# Patient Record
Sex: Male | Born: 1970 | Race: White | Hispanic: No | Marital: Single | Smoking: Never smoker
Health system: Southern US, Community
[De-identification: ages and names within clinical notes are randomized; demographics above are authoritative.]

---

## 1997-12-06 ENCOUNTER — Encounter: Payer: Self-pay | Admitting: Internal Medicine

## 1997-12-06 ENCOUNTER — Emergency Department (HOSPITAL_COMMUNITY): Admission: EM | Admit: 1997-12-06 | Discharge: 1997-12-06 | Payer: Self-pay | Admitting: Internal Medicine

## 1999-10-04 ENCOUNTER — Encounter: Payer: Self-pay | Admitting: *Deleted

## 1999-10-04 ENCOUNTER — Encounter: Payer: Self-pay | Admitting: Pulmonary Disease

## 1999-10-04 ENCOUNTER — Emergency Department (HOSPITAL_COMMUNITY): Admission: EM | Admit: 1999-10-04 | Discharge: 1999-10-04 | Payer: Self-pay | Admitting: *Deleted

## 1999-10-19 ENCOUNTER — Encounter: Payer: Self-pay | Admitting: Pulmonary Disease

## 2003-04-19 ENCOUNTER — Other Ambulatory Visit: Payer: Self-pay

## 2004-05-15 ENCOUNTER — Ambulatory Visit: Payer: Self-pay | Admitting: Pulmonary Disease

## 2004-06-15 ENCOUNTER — Ambulatory Visit: Payer: Self-pay | Admitting: Pulmonary Disease

## 2005-10-11 ENCOUNTER — Ambulatory Visit: Payer: Self-pay

## 2006-07-07 ENCOUNTER — Ambulatory Visit: Payer: Self-pay | Admitting: Pulmonary Disease

## 2007-06-11 ENCOUNTER — Telehealth (INDEPENDENT_AMBULATORY_CARE_PROVIDER_SITE_OTHER): Payer: Self-pay | Admitting: *Deleted

## 2007-06-25 DIAGNOSIS — J45909 Unspecified asthma, uncomplicated: Secondary | ICD-10-CM | POA: Insufficient documentation

## 2007-08-03 ENCOUNTER — Telehealth (INDEPENDENT_AMBULATORY_CARE_PROVIDER_SITE_OTHER): Payer: Self-pay | Admitting: *Deleted

## 2007-08-06 ENCOUNTER — Ambulatory Visit: Payer: Self-pay | Admitting: Pulmonary Disease

## 2007-11-17 ENCOUNTER — Telehealth (INDEPENDENT_AMBULATORY_CARE_PROVIDER_SITE_OTHER): Payer: Self-pay | Admitting: *Deleted

## 2007-11-23 ENCOUNTER — Telehealth (INDEPENDENT_AMBULATORY_CARE_PROVIDER_SITE_OTHER): Payer: Self-pay | Admitting: *Deleted

## 2008-07-18 ENCOUNTER — Telehealth (INDEPENDENT_AMBULATORY_CARE_PROVIDER_SITE_OTHER): Payer: Self-pay | Admitting: *Deleted

## 2008-08-26 ENCOUNTER — Ambulatory Visit: Payer: Self-pay | Admitting: Pulmonary Disease

## 2009-08-25 ENCOUNTER — Ambulatory Visit: Payer: Self-pay | Admitting: Pulmonary Disease

## 2009-08-25 DIAGNOSIS — J309 Allergic rhinitis, unspecified: Secondary | ICD-10-CM | POA: Insufficient documentation

## 2010-03-06 NOTE — Assessment & Plan Note (Signed)
Summary: yearly rov for asthma   CC:  Pt is here for a 1 yr f/u appt on his asthma.  Pt states his breathing is "good."  Pt denied a cough.  Pt states he uses his rescue hfa "very little."  .  History of Present Illness: The pt comes in today for f/u of his asthma.  He is doing very well on advair, and hardly ever requires his rescue inhaler.  He denies nocturnal symptoms or cough.  He does have allergy symptoms at times for which he takes claritin.  He doesn't think this helps much.  He also has itchy eyes that he thinks is due to allergies, and keeps him from wearing his contacts.    Current Medications (verified): 1)  Advair Diskus 250-50 Mcg/dose  Misc (Fluticasone-Salmeterol) .... One Puff Twice Daily 2)  Prilosec Otc 20 Mg  Tbec (Omeprazole Magnesium) .... Take 1 Tablet By Mouth Once A Day 3)  Proair Hfa 108 (90 Base) Mcg/act  Aers (Albuterol Sulfate) .... Inhale 2 Puffs Two Times A Day As Needed 4)  Tylenol .... As Needed  Allergies (verified): No Known Drug Allergies  Review of Systems  The patient denies shortness of breath with activity, shortness of breath at rest, productive cough, non-productive cough, coughing up blood, chest pain, irregular heartbeats, acid heartburn, indigestion, loss of appetite, weight change, abdominal pain, difficulty swallowing, sore throat, tooth/dental problems, headaches, nasal congestion/difficulty breathing through nose, sneezing, itching, ear ache, anxiety, depression, hand/feet swelling, joint stiffness or pain, rash, change in color of mucus, and fever.    Vital Signs:  Patient profile:   40 year old male Height:      69.5 inches Weight:      196 pounds BMI:     28.63 O2 Sat:      98 % on Room air Temp:     97.5 degrees F oral Pulse rate:   54 / minute BP sitting:   120 / 70  (left arm) Cuff size:   regular  Vitals Entered By: Arman Filter LPN (August 25, 2009 8:51 AM)  O2 Flow:  Room air CC: Pt is here for a 1 yr f/u appt on his  asthma.  Pt states his breathing is "good."  Pt denied a cough.  Pt states he uses his rescue hfa "very little."   Comments Medications reviewed with patient Arman Filter LPN  August 25, 2009 8:51 AM    Physical Exam  General:  wd male in nad Eyes:  not red or swollen. Nose:  no drainage or purulence noted. Lungs:  clear to auscultation Heart:  rrr, 2/6 sem.   Impression & Recommendations:  Problem # 1:  ASTHMA (ICD-493.90) the pt is doing very well on his current asthma regimen.  He rarely requires his rescue inhaler, and is happy with his exertional tolerance.  HIs only complaint is itchy eyes when his allergies bother him.  Sometimes it is just his eyes and nothing else.  I have asked him to continue on advair for maintenance, and also asked hm to try zyrtec as needed for allergies.  I have also given him samples of pataday for his eye symptoms to see if it will help.  He is to followup with me in one year.  Medications Added to Medication List This Visit: 1)  Tylenol  .... As needed  Other Orders: Est. Patient Level III (24401)  Patient Instructions: 1)  no change in asthma meds 2)  can try zyrtec 10mg   otc at bedtime if having a lot of allergy symptoms. 3)  can try pataday eye drops one drop in affected eye each day  4)  followup with me in one year if doing well.      Immunization History:  Influenza Immunization History:    Influenza:  historical (02/05/2008)

## 2010-06-22 NOTE — Consult Note (Signed)
[place]. [facility] [place]  Patient:    Andre Lamb, Andre Lamb                         MRN: [id number] Proc. Date: 10/04/99 Attending:  Barbaraann Share, M.D. [facility] CC:         Dr. Meliton Rattan, [facility]                          Consultation Report  HISTORY OF PRESENT ILLNESS:  The patient is a very pleasant 40 year old gentleman with a history of bronchial asthma, treated with albuterol p.r.n., who presents with a three to four day history of increasing chest congestion, chest tightness, with increasing shortness of breath as well as cough with purulent mucus.  The patient noticed increasing chest tightness and dry, hacking cough starting approximately three days ago and was seen by Dr. Earlene Plater and placed on Augmentin and asked to continue on his albuterol.  The patient since that time has had worsening congestion with green mucus occurring last night as well as a temperature to 100.1.He also had worsening of his chest congestion to the point that he became dyspneic even at rest.  He was seen by Dr. Earlene Plater this morning, who felt that he had status asthmaticus, and therefore he was transferred to the [facility] Emergency Room for evaluation.  Here he has had significant improvement and currently is in no distress, with no accessory muscle use.  He feels as though he is getting back to his usual baseline.  I have talked with the patient extensively about his asthma history that he has had since age 52.  He has needed very little medication except for p.r.n. albuterol.  However, when I questioned him in detail, he actually uses his albuterol at least every day, and girlfriend with him states sometimes twice a day.  He has not had any recent hospitalizations, nor has he had endotracheal intubation or ventilation because of asthma.  He does have occasional nocturnal awakenings.  PAST MEDICAL HISTORY:  Totally unremarkable except for his asthma.  He has never had any surgeries,  nor doeshe have any other medical illnesses.  ALLERGIES:  No known drug allergies.  MEDICATIONS:  His only medication is albuterol p.r.n.  SOCIAL HISTORY:  He has a very scant smoking history, primarily during his high school years.  Has not smoked since.  He denies alcohol abuse.  FAMILY HISTORY:  Remarkable for airway symptoms in his father, which he thinks is due to bronchial asthma.  Otherwise it is unremarkable.  REVIEW OF SYSTEMS:  As per history of present illness.  PHYSICAL EXAMINATION:  GENERAL:  He is a well-developed white male in no acute distress.  VITAL SIGNS:  Blood pressure is 135/70, pulse is 91, respiratory rate 28 at time of admission but only is 15 now.  He is afebrile.  O2 saturation on room air is 1005.  HEENT:  Pupils equal, round and reactive to light and accommodation. Extraocular muscles are intact.  Nares are patent without discharge. Oropharynx is clear; however, there is an extremely long uvula, which is touching the back of the tongue.  Thereis some mild injection in the posterior pharynx.  NECK:  Supple without JVD or lymphadenopathy.  There is no palpable thyromegaly.  CHEST:  Some rhonchi but absolutely no wheezes and fairly good air flow.  CARDIAC:  Regular rate and rhythm.  No murmurs, rubs, or gallops.  ABDOMEN:  Soft, nontender, with good bowel sounds.  GENITAL, RECTAL, BREASTS:  Not done, not indicated.  EXTREMITIES:  Lower extremities are without edema and with good pulses distally.  There is no calf tenderness.  NEUROLOGIC:  He is alert and oriented x 3 with no obvious motor deficits.  LABORATORY DATA:  Chest x-ray came with the patient andrevealed some mild hyperinflation but no acute process.  Lateral neck film ordered by Dr. Stacie Acres of the ER is pending at time of dictation.  Again, O2 saturation is excellent on room air at 100%.  IMPRESSION:Acute bronchitis with status asthmaticus.  The patient is much improved currently and  feels that he is able to go back home.  He does not have any accessory muscle use, nor is he in respiratory distress.  He has excellent O2 saturations and air flow on exam.  I do not hear wheezing on auscultation.  I do think that while he is here we should treat him aggressively with IV steroids as well as a dose of IV antibiotics and nebulizers.  If he continues to do well, I think he will be able to go home on an aggressive asthma regimen; however, if he has any difficulties at all, he should be admitted for at least an overnight stay.  PLAN: 1. Treat with nebulizers, IV steroids, and antibiotics in the emergency room. If he is able to go home, he should go home on Pulmicort Turbohaler two  puffs b.i.d., with p.r.n. albuterol.  I would also have him on a steroid    taper over approximately eight days.  I would also send him home on Tequin    400 mg p.o. q.d. for seven days. 2. Asthma education.  I have discussed the pathophysiology of asthma with him    extensively and feel very strongly that he needs inhaled corticosteroids on    a chronic basis in light of his excessive albuterol use. DD:  10/04/99 TD:  10/04/99 Job: [id number]

## 2010-08-23 ENCOUNTER — Encounter: Payer: Self-pay | Admitting: Pulmonary Disease

## 2010-08-24 ENCOUNTER — Ambulatory Visit (INDEPENDENT_AMBULATORY_CARE_PROVIDER_SITE_OTHER): Payer: BC Managed Care – PPO | Admitting: Pulmonary Disease

## 2010-08-24 ENCOUNTER — Encounter: Payer: Self-pay | Admitting: Pulmonary Disease

## 2010-08-24 VITALS — BP 120/76 | HR 98 | Temp 97.9°F | Ht 69.0 in | Wt 206.8 lb

## 2010-08-24 DIAGNOSIS — J45909 Unspecified asthma, uncomplicated: Secondary | ICD-10-CM | POA: Insufficient documentation

## 2010-08-24 NOTE — Patient Instructions (Signed)
No change in asthma meds. If you have any worsening of your symptoms, or do not get back to your normal in a few weeks, please call followup with me in one year.

## 2010-08-24 NOTE — Assessment & Plan Note (Signed)
The pt is doing well from an asthma standpoint, and rarely uses his rescue inhaler.  He is satisfied with the control of his symptoms and his exertional tolerance.  He has had a little more chest tightness recently, indicative of possible increase in airway tone, but he is othewise without complaint.  He has had a little more allergy symptoms as well since cleaning out his vacuum cleaner.  I have asked him to monitor, and to let us know if doesn't improve.

## 2010-08-24 NOTE — Progress Notes (Signed)
  Subjective:    Patient ID: Andre Lamb, male    DOB: [birth year], 39 y.o.   MRN: [id number]  HPI The pt comes in today for f/u of his known asthma.  He has been doing well, and denies increase in rescue inhaler use or flare since the last visit.  He is satisfied with the control of his symptoms.  He has mild chest tightness the last few days after cleaning up his vacuum cleaner.    Review of Systems  Constitutional: Negative for fever and unexpected weight change.  HENT: Negative for ear pain, nosebleeds, congestion, sore throat, rhinorrhea, sneezing, trouble swallowing, dental problem, postnasal drip and sinus pressure.   Eyes: Positive for redness and itching.  Respiratory: Positive for chest tightness. Negative for cough, shortness of breath and wheezing.   Cardiovascular: Negative for palpitations and leg swelling.  Gastrointestinal: Negative for nausea and vomiting.  Genitourinary: Negative for dysuria.  Musculoskeletal: Negative for joint swelling.  Skin: Positive for rash.  Neurological: Negative for headaches.  Hematological: Does not bruise/bleed easily.  Psychiatric/Behavioral: Negative for dysphoric mood. The patient is not nervous/anxious.        Objective:   Physical Exam Wd male in nad No purulence or discharge noted from nares. Chest totally clear to auscultation Cor with rrr LE without edema, no cyanosis  Alert, oriented, moves all 4        Assessment & Plan:

## 2010-09-11 ENCOUNTER — Other Ambulatory Visit: Payer: Self-pay | Admitting: Pulmonary Disease

## 2010-09-28 ENCOUNTER — Other Ambulatory Visit: Payer: Self-pay | Admitting: Pulmonary Disease

## 2011-01-31 ENCOUNTER — Other Ambulatory Visit: Payer: Self-pay | Admitting: Pulmonary Disease

## 2011-03-12 ENCOUNTER — Telehealth: Payer: Self-pay | Admitting: Pulmonary Disease

## 2011-03-12 NOTE — Telephone Encounter (Signed)
I spoke with pt and he states he needs a sample of advair bc he has ran out and he is still waiting on his mail order to come in. I advised pt will leave 1 sample upfront since he is out. Nothing further was eneded

## 2011-08-30 ENCOUNTER — Ambulatory Visit: Payer: BC Managed Care – PPO | Admitting: Pulmonary Disease

## 2011-09-03 ENCOUNTER — Ambulatory Visit: Payer: BC Managed Care – PPO | Admitting: Pulmonary Disease

## 2011-09-27 ENCOUNTER — Ambulatory Visit: Payer: BC Managed Care – PPO | Admitting: Pulmonary Disease

## 2011-11-01 ENCOUNTER — Ambulatory Visit (INDEPENDENT_AMBULATORY_CARE_PROVIDER_SITE_OTHER): Payer: BC Managed Care – PPO | Admitting: Pulmonary Disease

## 2011-11-01 ENCOUNTER — Encounter: Payer: Self-pay | Admitting: Pulmonary Disease

## 2011-11-01 VITALS — BP 132/90 | HR 69 | Temp 98.4°F | Ht 69.5 in | Wt 205.0 lb

## 2011-11-01 DIAGNOSIS — J45909 Unspecified asthma, uncomplicated: Secondary | ICD-10-CM

## 2011-11-01 NOTE — Assessment & Plan Note (Signed)
The pt is doing well on advair, and rarely uses his rescue inhaler.  He has a viral URI currently, but having no asthma issues.  I have asked him to continue on his usual meds, but call if he has breathing issues.

## 2011-11-01 NOTE — Patient Instructions (Addendum)
No change in your maintenance medications followup with me in one year, but call if you are having breathing issues or increased rescue inhaler usage.

## 2011-11-01 NOTE — Progress Notes (Signed)
  Subjective:    Patient ID: Andre Lamb, male    DOB: 04/20/1970, 41 y.o.   MRN: [id number]  HPI Patient comes in today for followup of his known asthma.  He has been taking his Advair compliantly, and has been doing very well from an asthma standpoint.  He has not had an acute exacerbation, and rarely uses his rescue inhaler.  He has had what sounds like a typical viral URI for the last few days, but this is getting better.   Review of Systems  Constitutional: Negative for fever and unexpected weight change.  HENT: Positive for congestion, sore throat, rhinorrhea, sneezing, postnasal drip and sinus pressure. Negative for ear pain, nosebleeds, trouble swallowing and dental problem.   Eyes: Negative for redness and itching.  Respiratory: Positive for cough and wheezing. Negative for chest tightness and shortness of breath.   Cardiovascular: Negative for palpitations and leg swelling.  Gastrointestinal: Negative for nausea and vomiting.  Genitourinary: Negative for dysuria.  Musculoskeletal: Negative for joint swelling.  Skin: Negative for rash.  Neurological: Positive for headaches.  Hematological: Does not bruise/bleed easily.  Psychiatric/Behavioral: Negative for dysphoric mood. The patient is not nervous/anxious.        Objective:   Physical Exam Overweight male in no acute distress Nose without purulence or discharge noted Chest with totally clear bs, no wheezing Cardiac exam is regular rate and rhythm Lower extremities without edema, no cyanosis Alert and oriented, moves all 4 extremities.       Assessment & Plan:

## 2011-12-03 ENCOUNTER — Ambulatory Visit (INDEPENDENT_AMBULATORY_CARE_PROVIDER_SITE_OTHER): Payer: BC Managed Care – PPO

## 2011-12-03 DIAGNOSIS — Z23 Encounter for immunization: Secondary | ICD-10-CM

## 2012-03-21 ENCOUNTER — Other Ambulatory Visit: Payer: Self-pay | Admitting: Pulmonary Disease

## 2012-03-24 ENCOUNTER — Telehealth: Payer: Self-pay | Admitting: Pulmonary Disease

## 2012-03-24 MED ORDER — FLUTICASONE-SALMETEROL 250-50 MCG/DOSE IN AEPB: 1.0000 | INHALATION_SPRAY | Freq: Two times a day (BID) | RESPIRATORY_TRACT | Status: DC

## 2012-03-24 MED ORDER — ALBUTEROL SULFATE HFA 108 (90 BASE) MCG/ACT IN AERS: 2.0000 | INHALATION_SPRAY | Freq: Four times a day (QID) | RESPIRATORY_TRACT | Status: DC | PRN

## 2012-03-24 NOTE — Telephone Encounter (Signed)
No pending medications requested. Will close refill request note.

## 2012-03-24 NOTE — Telephone Encounter (Signed)
I spoke with pt and he stated he needed a refill on advair and albuterol. Pt stated the advair needs to go to mail order and albuterol to the local pharmacy. I advised pt will send RX. Nothing further was needed

## 2012-04-02 ENCOUNTER — Telehealth: Payer: Self-pay | Admitting: Pulmonary Disease

## 2012-04-02 MED ORDER — FLUTICASONE-SALMETEROL 250-50 MCG/DOSE IN AEPB: 1.0000 | INHALATION_SPRAY | Freq: Two times a day (BID) | RESPIRATORY_TRACT | Status: DC

## 2012-04-02 NOTE — Telephone Encounter (Signed)
[facility]  LMOM for the pt to be made aware

## 2012-08-05 ENCOUNTER — Telehealth: Payer: Self-pay | Admitting: Pulmonary Disease

## 2012-08-05 MED ORDER — FLUTICASONE-SALMETEROL 250-50 MCG/DOSE IN AEPB: 1.0000 | INHALATION_SPRAY | Freq: Two times a day (BID) | RESPIRATORY_TRACT | Status: DC

## 2012-08-05 MED ORDER — ALBUTEROL SULFATE HFA 108 (90 BASE) MCG/ACT IN AERS: 2.0000 | INHALATION_SPRAY | Freq: Four times a day (QID) | RESPIRATORY_TRACT | Status: DC | PRN

## 2012-08-05 NOTE — Telephone Encounter (Signed)
Both rx's have been sent in. Pending OV in 10/2012. Pt is aware. Nothing further was needed.

## 2012-10-16 ENCOUNTER — Ambulatory Visit (INDEPENDENT_AMBULATORY_CARE_PROVIDER_SITE_OTHER): Payer: BC Managed Care – PPO | Admitting: Pulmonary Disease

## 2012-10-16 ENCOUNTER — Encounter: Payer: Self-pay | Admitting: Pulmonary Disease

## 2012-10-16 VITALS — BP 122/84 | HR 71 | Temp 99.0°F | Ht 69.5 in | Wt 207.2 lb

## 2012-10-16 DIAGNOSIS — Z23 Encounter for immunization: Secondary | ICD-10-CM

## 2012-10-16 DIAGNOSIS — J45909 Unspecified asthma, uncomplicated: Secondary | ICD-10-CM

## 2012-10-16 NOTE — Patient Instructions (Addendum)
Will give you a flu shot today No change in medications followup with me in one year if doing well.

## 2012-10-16 NOTE — Progress Notes (Signed)
  Subjective:    Patient ID: Andre Lamb, male    DOB: 01-Apr-1970, 42 y.o.   MRN: [id number]  HPI Patient comes in today for followup of his known asthma.  He has done very well since last visit, with no acute exacerbation.  He rarely uses his albuterol rescue unless he develops a cold or is in the high heat and humidity.  He is satisfied with his exertional tolerance currently.   Review of Systems  Constitutional: Negative for fever and unexpected weight change.  HENT: Negative for ear pain, nosebleeds, congestion, sore throat, rhinorrhea, sneezing, trouble swallowing, dental problem, postnasal drip and sinus pressure.   Eyes: Negative for redness and itching.  Respiratory: Negative for cough, chest tightness, shortness of breath and wheezing.   Cardiovascular: Negative for palpitations and leg swelling.  Gastrointestinal: Negative for nausea and vomiting.  Genitourinary: Negative for dysuria.  Musculoskeletal: Negative for joint swelling.  Skin: Negative for rash.  Neurological: Negative for headaches.  Hematological: Does not bruise/bleed easily.  Psychiatric/Behavioral: Negative for dysphoric mood. The patient is not nervous/anxious.        Objective:   Physical Exam Overweight male in no acute distress Nose without purulence or discharge noted Neck without lymphadenopathy or thyromegaly Chest totally clear to auscultation, no wheezing Cardiac exam is regular rate and rhythm Lower extremities without edema, no cyanosis Alert and oriented, moves all 4 extremities.        Assessment & Plan:

## 2012-10-16 NOTE — Assessment & Plan Note (Signed)
The patient is stable on his current dilator regimen, and I encouraged him to stay on this compliantly.  We'll give him the flu shot today, and see him back in one year.

## 2012-10-16 NOTE — Addendum Note (Signed)
Addended by: Orma Flaming D on: 10/16/2012 02:00 PM   Modules accepted: Orders

## 2013-06-29 ENCOUNTER — Emergency Department: Payer: Self-pay | Admitting: Emergency Medicine

## 2013-08-13 ENCOUNTER — Other Ambulatory Visit: Payer: Self-pay | Admitting: Pulmonary Disease

## 2013-10-14 ENCOUNTER — Other Ambulatory Visit: Payer: Self-pay | Admitting: Pulmonary Disease

## 2013-10-22 ENCOUNTER — Ambulatory Visit (INDEPENDENT_AMBULATORY_CARE_PROVIDER_SITE_OTHER): Payer: BC Managed Care – PPO | Admitting: Pulmonary Disease

## 2013-10-22 ENCOUNTER — Encounter: Payer: Self-pay | Admitting: Pulmonary Disease

## 2013-10-22 VITALS — BP 128/82 | HR 64 | Temp 98.0°F | Ht 69.5 in | Wt 201.4 lb

## 2013-10-22 DIAGNOSIS — J4520 Mild intermittent asthma, uncomplicated: Secondary | ICD-10-CM

## 2013-10-22 DIAGNOSIS — J45909 Unspecified asthma, uncomplicated: Secondary | ICD-10-CM

## 2013-10-22 DIAGNOSIS — Z23 Encounter for immunization: Secondary | ICD-10-CM

## 2013-10-22 NOTE — Patient Instructions (Signed)
No change in meds Continue to stay as active as possible. Will give you a flu shot today. followup with me again in one year.

## 2013-10-22 NOTE — Assessment & Plan Note (Signed)
The patient is doing very well from an asthma standpoint on his current regimen. I've asked him to continue on this, and to keep as active as possible. I will give him the flu shot today, and see him back in one year if doing well.

## 2013-10-22 NOTE — Progress Notes (Signed)
   Subjective:    Patient ID: Andre Lamb, male    DOB: 01/14/1971, 43 y.o.   MRN: [id number]  HPI The patient comes in today for followup of his known asthma. He is doing extremely well his current regimen, and almost never requires his rescue inhaler. He feels that his breathing is well, and has no significant cough.   Review of Systems  Constitutional: Negative for fever and unexpected weight change.  HENT: Negative for congestion, dental problem, ear pain, nosebleeds, postnasal drip, rhinorrhea, sinus pressure, sneezing, sore throat and trouble swallowing.   Eyes: Negative for redness and itching.  Respiratory: Negative for cough, chest tightness, shortness of breath and wheezing.   Cardiovascular: Negative for palpitations and leg swelling.  Gastrointestinal: Negative for nausea and vomiting.  Genitourinary: Negative for dysuria.  Musculoskeletal: Negative for joint swelling.  Skin: Negative for rash.  Neurological: Negative for headaches.  Hematological: Does not bruise/bleed easily.  Psychiatric/Behavioral: Negative for dysphoric mood. The patient is not nervous/anxious.        Objective:   Physical Exam Well-developed male in no acute distress Nose without purulence or discharge noted Neck without lymphadenopathy or thyromegaly Chest totally clear to auscultation Cardiac exam with regular rate and rhythm Lower extremities without edema, no cyanosis Alert and oriented, moves all 4 extremities.       Assessment & Plan:

## 2014-01-29 ENCOUNTER — Other Ambulatory Visit: Payer: Self-pay | Admitting: Pulmonary Disease

## 2014-04-23 ENCOUNTER — Other Ambulatory Visit: Payer: Self-pay | Admitting: Pulmonary Disease

## 2014-06-15 ENCOUNTER — Other Ambulatory Visit: Payer: Self-pay | Admitting: Pulmonary Disease

## 2014-07-25 ENCOUNTER — Telehealth: Payer: Self-pay | Admitting: Pulmonary Disease

## 2014-07-25 NOTE — Telephone Encounter (Signed)
Per 10/20/13 OV w/ KC: Patient Instructions       No change in meds Continue to stay as active as possible. Will give you a flu shot today. followup with me again in one year  --  Pt needs to be scheduled with new provider (any one is fine) in september for 1 year follow up Called pt and LMTCB

## 2014-07-26 NOTE — Telephone Encounter (Signed)
ATC patient - number is not in service today wcb

## 2014-08-10 ENCOUNTER — Telehealth: Payer: Self-pay

## 2014-08-10 MED ORDER — FLUTICASONE-SALMETEROL 250-50 MCG/DOSE IN AEPB
INHALATION_SPRAY | RESPIRATORY_TRACT | Status: DC
Start: 2014-08-10 — End: 2014-12-09

## 2014-08-10 NOTE — Telephone Encounter (Signed)
Pt is aware RX has been sent in. Nothing further needed 

## 2014-09-30 ENCOUNTER — Institutional Professional Consult (permissible substitution): Payer: Self-pay | Admitting: Emergency Medicine

## 2014-10-28 ENCOUNTER — Ambulatory Visit: Payer: BC Managed Care – PPO | Admitting: Pulmonary Disease

## 2014-11-02 ENCOUNTER — Other Ambulatory Visit: Payer: Self-pay | Admitting: Emergency Medicine

## 2014-11-04 ENCOUNTER — Ambulatory Visit (INDEPENDENT_AMBULATORY_CARE_PROVIDER_SITE_OTHER): Payer: BLUE CROSS/BLUE SHIELD | Admitting: Emergency Medicine

## 2014-11-04 ENCOUNTER — Encounter: Payer: Self-pay | Admitting: Emergency Medicine

## 2014-11-04 VITALS — BP 124/68 | HR 68 | Ht 69.5 in | Wt 193.4 lb

## 2014-11-04 DIAGNOSIS — Z23 Encounter for immunization: Secondary | ICD-10-CM

## 2014-11-04 DIAGNOSIS — J4520 Mild intermittent asthma, uncomplicated: Secondary | ICD-10-CM | POA: Diagnosis not present

## 2014-11-04 DIAGNOSIS — J309 Allergic rhinitis, unspecified: Secondary | ICD-10-CM | POA: Diagnosis not present

## 2014-11-04 NOTE — Assessment & Plan Note (Signed)
Has not required scheduled meds. Does occasionally use an antihistamine when necessary

## 2014-11-04 NOTE — Assessment & Plan Note (Signed)
Good control. We will continue his Advair twice a day and albuterol as needed.  He needs baseline spirometry. We will plan to perform this at his next follow-up visit He needs a flu shot today. We will also check to see if he has insurance coverage to get Prevnar 13 today. If so we will give this as well  follow-up annually and when necessary

## 2014-11-04 NOTE — Patient Instructions (Signed)
Please continue Advair twice a day as you have been taking it. Remember to rinse and gargle after using Keep albuterol available to use 2 puffs if needed for shortness of breath or wheezing We will repeat your spirometry at your next follow-up visit Flu shot today We may give the Prevnar 13 pneumococcal vaccine today depending on your insurance coverage Follow with Dr Delton Coombes in 12 months or sooner if you have any problems

## 2014-11-04 NOTE — Progress Notes (Signed)
Subjective:    Patient ID: Andre Lamb, male    DOB: 1970/08/28, 44 y.o.   MRN: [id number]  HPI                                                                                 44 year old never smoker who has been followed by Dr Shelle Iron for asthma. He was first dx around age 67. He has been on daily meds for over 10 yrs. He is on Advair, tolerates well. Rarely uses albuterol - happens when hot or during the pollen season. Rarely hears wheeze, only with URI or allergies.  No daily sx. Good exercise tolerance. Rare SABA use. Good GERD control on omeprazole. He has never had Prevnar-13. Needs the flu shot   No Exacerbations since last visit    Review of Systems As per HPI  Past Medical History  Diagnosis Date  . Asthma      No family history on file.   Social History   Social History  . Marital Status: Single    Spouse Name: N/A  . Number of Children: N/A  . Years of Education: N/A   Occupational History  . Not on file.   Social History Main Topics  . Smoking status: Never Smoker   . Smokeless tobacco: Not on file  . Alcohol Use: Not on file  . Drug Use: Not on file  . Sexual Activity: Not on file   Other Topics Concern  . Not on file   Social History Narrative     No Known Allergies   Outpatient Prescriptions Prior to Visit  Medication Sig Dispense Refill  . acetaminophen (TYLENOL) 325 MG tablet Take 325 mg by mouth as needed.      . Fluticasone-Salmeterol (ADVAIR DISKUS) 250-50 MCG/DOSE AEPB INHALE 1 PUFF INTO THE LUNGS 2 (TWO) TIMES DAILY. 180 each 0  . Multiple Vitamins-Minerals (MULTIVITAMIN PO) Take 1 tablet by mouth daily.    Marland Kitchen omeprazole (PRILOSEC) 20 MG capsule Take 20 mg by mouth daily.      Marland Kitchen PROAIR HFA 108 (90 BASE) MCG/ACT inhaler USE 2 PUFFS EVERY 6 HOURS AS NEEDED FOR WHEEZING 8.5 Inhaler 1   No facility-administered medications prior to visit.         Objective:   Physical Exam Filed Vitals:   11/04/14 1323  BP: 124/68  Pulse: 68    Height: 5' 9.5" (1.765 m)  Weight: 193 lb 6.4 oz (87.726 kg)  SpO2: 97%   Gen: Pleasant, well-nourished, in no distress,  normal affect  ENT: No lesions,  mouth clear,  oropharynx clear, no postnasal drip  Neck: No JVD, no TMG, no carotid bruits  Lungs: No use of accessory muscles, clear without rales or rhonchi  Cardiovascular: RRR, heart sounds normal, no murmur or gallops, no peripheral edema  Musculoskeletal: No deformities, no cyanosis or clubbing  Neuro: alert, non focal  Skin: Warm, no lesions or rashes      Assessment & Plan:  Allergic rhinitis Has not required scheduled meds. Does occasionally use an antihistamine when necessary  Asthma, intrinsic Good control. We will continue his Advair twice a day and albuterol as needed.  He needs baseline spirometry.  We will plan to perform this at his next follow-up visit He needs a flu shot today. We will also check to see if he has insurance coverage to get Prevnar 13 today. If so we will give this as well  follow-up annually and when necessary

## 2014-12-09 ENCOUNTER — Other Ambulatory Visit: Payer: Self-pay | Admitting: Emergency Medicine

## 2014-12-23 ENCOUNTER — Telehealth: Payer: Self-pay | Admitting: Emergency Medicine

## 2014-12-23 MED ORDER — ALBUTEROL SULFATE HFA 108 (90 BASE) MCG/ACT IN AERS: INHALATION_SPRAY | RESPIRATORY_TRACT | Status: DC

## 2014-12-23 NOTE — Telephone Encounter (Signed)
[facility] Nothing further needed[facility]

## 2015-04-09 ENCOUNTER — Other Ambulatory Visit: Payer: Self-pay | Admitting: Emergency Medicine

## 2015-08-03 ENCOUNTER — Other Ambulatory Visit: Payer: Self-pay | Admitting: Emergency Medicine

## 2015-08-15 ENCOUNTER — Telehealth: Payer: Self-pay | Admitting: Emergency Medicine

## 2015-08-15 MED ORDER — ALBUTEROL SULFATE HFA 108 (90 BASE) MCG/ACT IN AERS: INHALATION_SPRAY | RESPIRATORY_TRACT | Status: DC

## 2015-08-15 NOTE — Telephone Encounter (Signed)
Pt is aware that he was last seen 10-2014 and due again to see RB 10-2015. [facility] [place] [place] Confirmed with patient. Pt will call back to make appt with RB. Nothing more needed at this time.

## 2015-09-18 IMAGING — CR DG LUMBAR SPINE 2-3V
1 series · 3 of 3 positions shown · non-contrast
Comparison: None.

CLINICAL DATA: Low back pain

EXAM:
LUMBAR SPINE - 2-3 VIEW

[Series 1: t lumbar spine ap · 0.14mm/px · 3 of 3 slices shown]
[im 1/3]
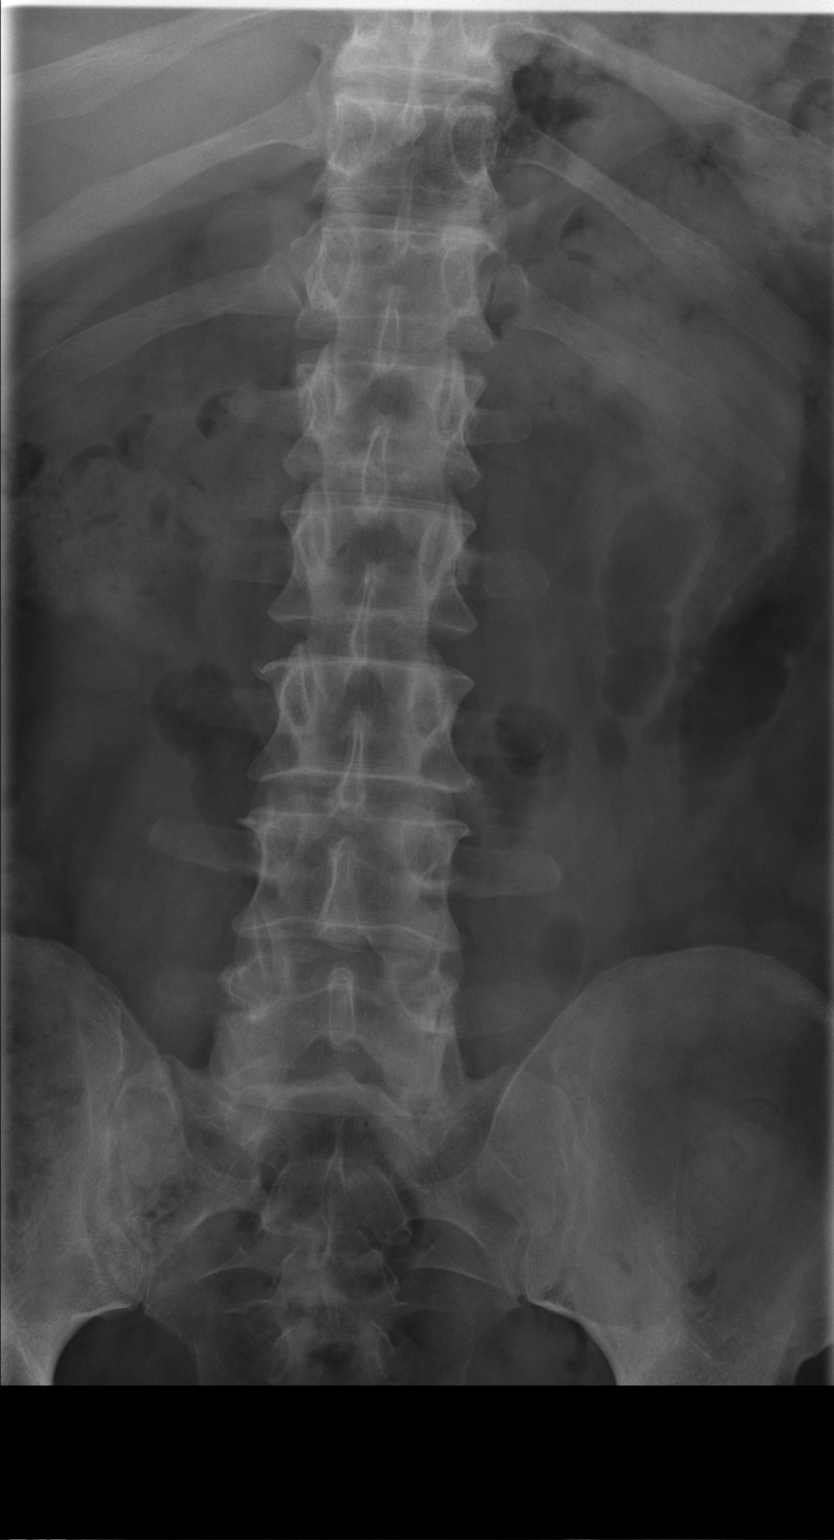
[im 2/3]
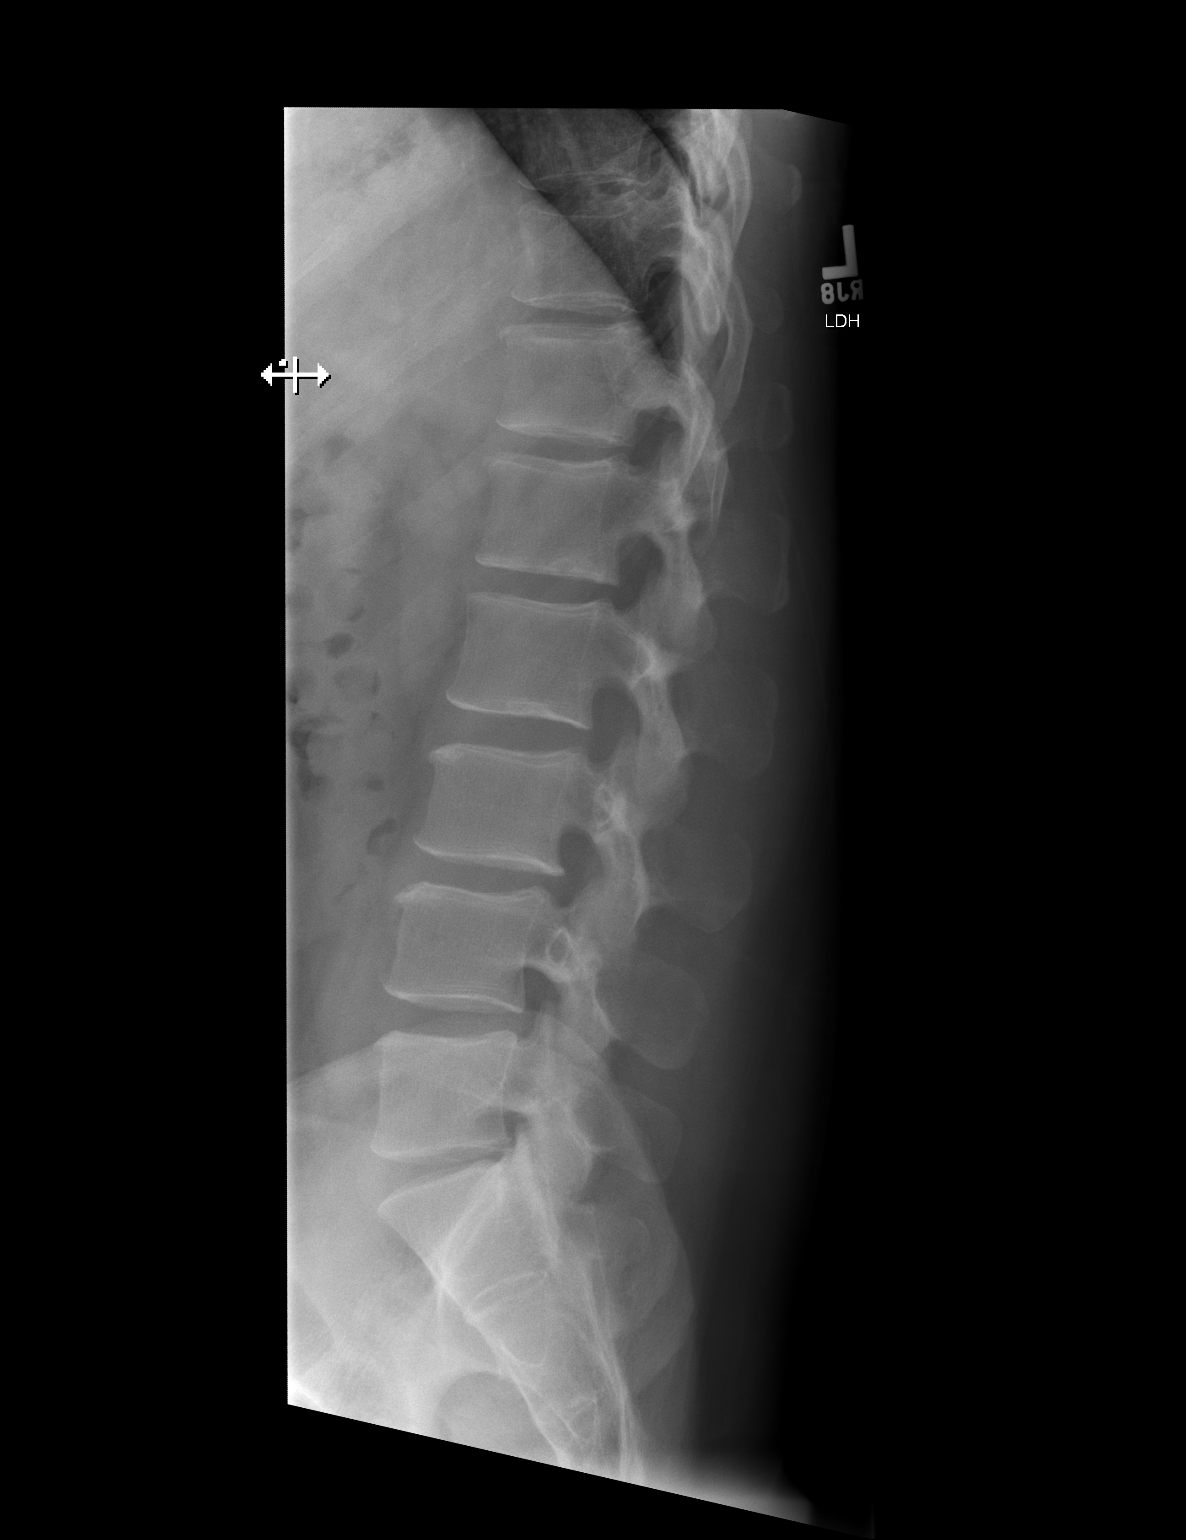
[im 3/3]
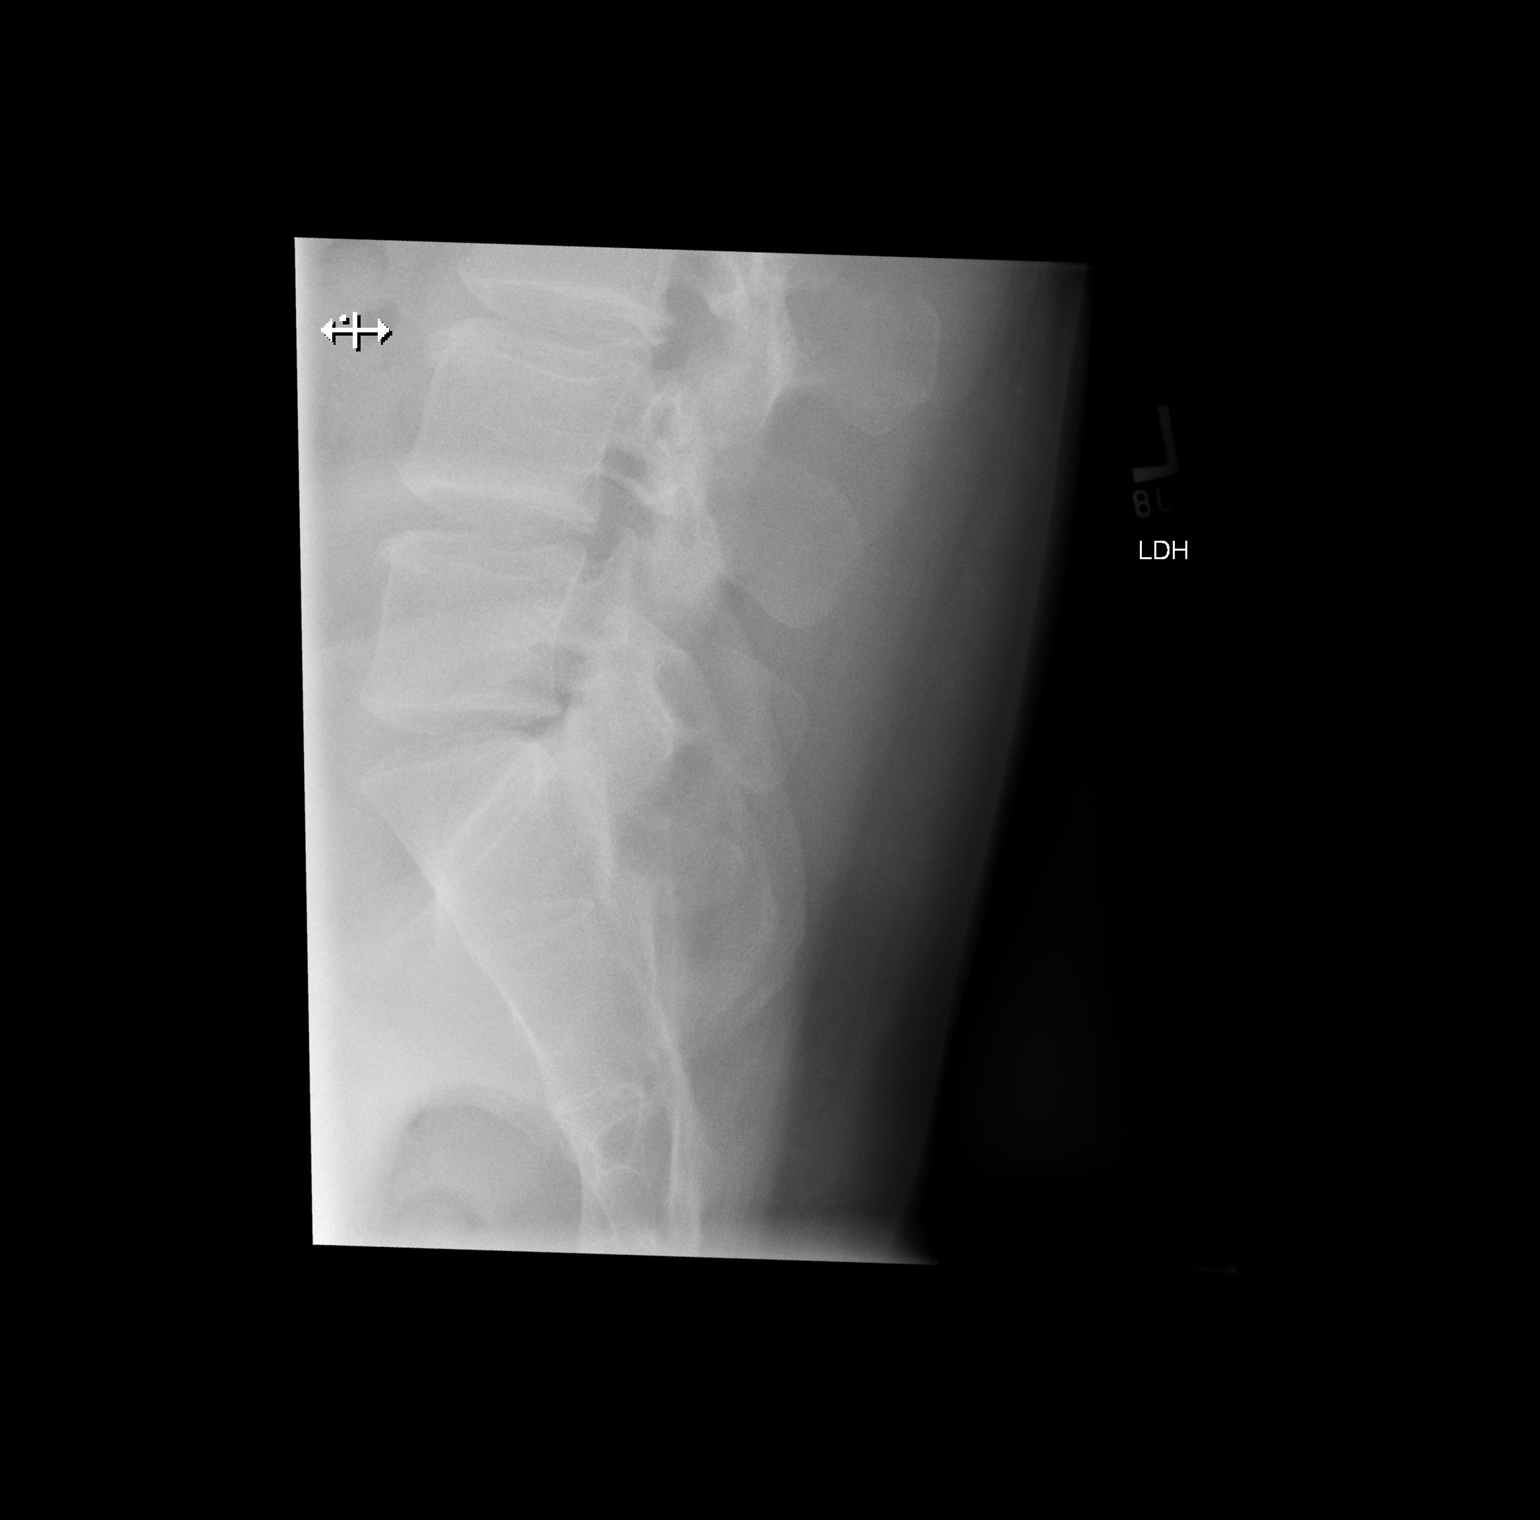

[3 of 3 positions shown; findings below may reference images not displayed]

FINDINGS: Five lumbar type vertebral bodies are well visualized. Minimal
osteophytic changes are seen. No significant disc space narrowing is
noted.
IMPRESSION: Mild degenerative change without acute abnormality.

## 2016-01-12 ENCOUNTER — Other Ambulatory Visit: Payer: Self-pay | Admitting: Emergency Medicine

## 2016-03-25 ENCOUNTER — Telehealth: Payer: Self-pay | Admitting: Emergency Medicine

## 2016-03-25 MED ORDER — FLUTICASONE-SALMETEROL 250-50 MCG/DOSE IN AEPB: 1.0000 | INHALATION_SPRAY | Freq: Two times a day (BID) | RESPIRATORY_TRACT | 0 refills | Status: DC

## 2016-03-25 MED ORDER — ALBUTEROL SULFATE HFA 108 (90 BASE) MCG/ACT IN AERS: INHALATION_SPRAY | RESPIRATORY_TRACT | 0 refills | Status: DC

## 2016-03-25 NOTE — Telephone Encounter (Signed)
rx's sent to preferred pharmacy.  Stressed importance of keeping rov for any future refills.  Pt expressed understanding, verified rov.  Nothing further needed.

## 2016-05-17 ENCOUNTER — Encounter: Payer: Self-pay | Admitting: Emergency Medicine

## 2016-05-17 ENCOUNTER — Ambulatory Visit (INDEPENDENT_AMBULATORY_CARE_PROVIDER_SITE_OTHER): Payer: BLUE CROSS/BLUE SHIELD | Admitting: Emergency Medicine

## 2016-05-17 DIAGNOSIS — J45909 Unspecified asthma, uncomplicated: Secondary | ICD-10-CM

## 2016-05-17 DIAGNOSIS — J301 Allergic rhinitis due to pollen: Secondary | ICD-10-CM | POA: Diagnosis not present

## 2016-05-17 DIAGNOSIS — K219 Gastro-esophageal reflux disease without esophagitis: Secondary | ICD-10-CM | POA: Insufficient documentation

## 2016-05-17 MED ORDER — ALBUTEROL SULFATE HFA 108 (90 BASE) MCG/ACT IN AERS: INHALATION_SPRAY | RESPIRATORY_TRACT | 3 refills | Status: AC

## 2016-05-17 MED ORDER — FLUTICASONE-SALMETEROL 250-50 MCG/DOSE IN AEPB: 1.0000 | INHALATION_SPRAY | Freq: Two times a day (BID) | RESPIRATORY_TRACT | 3 refills | Status: AC

## 2016-05-17 NOTE — Patient Instructions (Signed)
Please continue your inhaled medications as you are taking them  Continue your Zyrtec during the Spring months Continue prilosec as you are taking it.  Get the flu shot in the Fall.  Follow with Dr Annelise Mccoy in 12 months or sooner if you have any problems 

## 2016-05-17 NOTE — Addendum Note (Signed)
Addended by: Cydney Ok on: 05/17/2016 03:50 PM   Modules accepted: Orders

## 2016-05-17 NOTE — Assessment & Plan Note (Signed)
Please continue your inhaled medications as you are taking them  Continue your Zyrtec during the Spring months Continue prilosec as you are taking it.  Get the flu shot in the Fall.  Follow with Dr Delton Coombes in 12 months or sooner if you have any problems

## 2016-05-17 NOTE — Progress Notes (Signed)
Subjective:    Patient ID: Andre Lamb, male    DOB: 09/29/1970, 46 y.o.   MRN: [id number]  HPI                                                                                 46 year old never smoker who has been followed by Dr Shelle Iron for asthma. He was first dx around age 8. He has been on daily meds for over 10 yrs. He is on Advair, tolerates well. Rarely uses albuterol - happens when hot or during the pollen season. Rarely hears wheeze, only with URI or allergies.  No daily sx. Good exercise tolerance. Rare SABA use. Good GERD control on omeprazole. He has never had Prevnar-13. Needs the flu shot   No Exacerbations since last visit  ROV 05/17/16 -- follow up visit for asthma, allergic rhinitis. Currently managed on Advair.  He has been doing well, does sometimes notice more dyspnea in the heat and with the allergies. He is on zyrtec - taking reliably during the spring months. He uses albuterol about once a week. No flares since last time. He is active, is able to exert. Exposed to some pollen and dust. He is on prilosec - well controlled.     Review of Systems As per HPI  Past Medical History:  Diagnosis Date  . Asthma      No family history on file.   Social History   Social History  . Marital status: Single    Spouse name: N/A  . Number of children: N/A  . Years of education: N/A   Occupational History  . Not on file.   Social History Main Topics  . Smoking status: Never Smoker  . Smokeless tobacco: Never Used  . Alcohol use Not on file  . Drug use: Unknown  . Sexual activity: Not on file   Other Topics Concern  . Not on file   Social History Narrative  . No narrative on file     No Known Allergies   Outpatient Medications Prior to Visit  Medication Sig Dispense Refill  . acetaminophen (TYLENOL) 325 MG tablet Take 325 mg by mouth as needed.      Marland Kitchen albuterol (PROAIR HFA) 108 (90 Base) MCG/ACT inhaler USE 2 PUFFS EVERY 6 HOURS AS NEEDED FOR WHEEZING 8.5 g 0    . Fluticasone-Salmeterol (ADVAIR DISKUS) 250-50 MCG/DOSE AEPB Inhale 1 puff into the lungs 2 (two) times daily. 180 each 0  . Multiple Vitamins-Minerals (MULTIVITAMIN PO) Take 1 tablet by mouth daily.    Marland Kitchen omeprazole (PRILOSEC) 20 MG capsule Take 20 mg by mouth daily.       No facility-administered medications prior to visit.          Objective:   Physical Exam Vitals:   05/17/16 1520  BP: 140/80  Pulse: 70  SpO2: 97%  Weight: 196 lb 12.8 oz (89.3 kg)  Height: 5' 9.5" (1.765 m)   Gen: Pleasant, well-nourished, in no distress,  normal affect  ENT: No lesions,  mouth clear,  oropharynx clear, no postnasal drip  Neck: No JVD, no TMG, no carotid bruits  Lungs: No use of accessory muscles, clear without rales  or rhonchi  Cardiovascular: RRR, heart sounds normal, no murmur or gallops, no peripheral edema  Musculoskeletal: No deformities, no cyanosis or clubbing  Neuro: alert, non focal  Skin: Warm, no lesions or rashes      Assessment & Plan:  Asthma, intrinsic Please continue your inhaled medications as you are taking them  Continue your Zyrtec during the Spring months Continue prilosec as you are taking it.  Get the flu shot in the Fall.  Follow with Dr Delton Coombes in 12 months or sooner if you have any problems  Allergic rhinitis Symptomatic during the Spring - zyrtec.   GERD (gastroesophageal reflux disease) Omeprazole   Levy Pupa, MD, PhD 05/17/2016, 3:46 PM [facility] [phone] or if no answer [phone]

## 2016-05-17 NOTE — Assessment & Plan Note (Signed)
Symptomatic during the Spring - zyrtec.

## 2016-05-17 NOTE — Assessment & Plan Note (Signed)
Omeprazole

## 2018-11-05 DEATH — deceased
